# Patient Record
Sex: Male | Born: 1955 | Race: Black or African American | Hispanic: No | Marital: Married
Health system: Southern US, Community
[De-identification: ages and names within clinical notes are randomized; demographics above are authoritative.]

---

## 2021-01-10 ENCOUNTER — Emergency Department (HOSPITAL_COMMUNITY): Payer: BC Managed Care – PPO

## 2021-01-10 ENCOUNTER — Emergency Department (HOSPITAL_COMMUNITY)
Admission: EM | Admit: 2021-01-10 | Discharge: 2021-01-11 | Disposition: A | Discharge status: Other Acute Inpatient Hospital | Payer: BC Managed Care – PPO | Attending: Emergency Medicine | Admitting: Emergency Medicine

## 2021-01-10 ENCOUNTER — Encounter (HOSPITAL_COMMUNITY): Payer: Self-pay | Admitting: Emergency Medicine

## 2021-01-10 ENCOUNTER — Other Ambulatory Visit: Payer: Self-pay

## 2021-01-10 DIAGNOSIS — S62646A Nondisplaced fracture of proximal phalanx of right little finger, initial encounter for closed fracture: Secondary | ICD-10-CM

## 2021-01-10 DIAGNOSIS — S51811A Laceration without foreign body of right forearm, initial encounter: Secondary | ICD-10-CM | POA: Insufficient documentation

## 2021-01-10 DIAGNOSIS — S59911A Unspecified injury of right forearm, initial encounter: Secondary | ICD-10-CM | POA: Diagnosis present

## 2021-01-10 DIAGNOSIS — W19XXXA Unspecified fall, initial encounter: Secondary | ICD-10-CM

## 2021-01-10 DIAGNOSIS — R52 Pain, unspecified: Secondary | ICD-10-CM

## 2021-01-10 DIAGNOSIS — Z20822 Contact with and (suspected) exposure to covid-19: Secondary | ICD-10-CM | POA: Diagnosis not present

## 2021-01-10 DIAGNOSIS — Y9367 Activity, basketball: Secondary | ICD-10-CM | POA: Insufficient documentation

## 2021-01-10 DIAGNOSIS — M79675 Pain in left toe(s): Secondary | ICD-10-CM | POA: Insufficient documentation

## 2021-01-10 DIAGNOSIS — S50822A Blister (nonthermal) of left forearm, initial encounter: Secondary | ICD-10-CM | POA: Diagnosis not present

## 2021-01-10 DIAGNOSIS — M7989 Other specified soft tissue disorders: Secondary | ICD-10-CM

## 2021-01-10 MED ORDER — TETANUS-DIPHTH-ACELL PERTUSSIS 5-2.5-18.5 LF-MCG/0.5 IM SUSY: 0.5000 mL | PREFILLED_SYRINGE | Freq: Once | INTRAMUSCULAR | Status: DC

## 2021-01-10 NOTE — Consult Note (Signed)
ORTHOPAEDIC CONSULTATION  REQUESTING PHYSICIAN: Palumbo, April, MD  Time called na Time arrived na  Chief Complaint: right arm injury   HPI: George Dunlap. is a 65 y.o. male who complains of a fall playing basketball today under his right arm.  He takes an aspirin daily.  This occurred today around 6:00.  He has had some pain at his small finger a burning pain over his dorsal forearm and swelling.  It is worsened in the emergency room there is a fair amount of blistering over his dorsal forearm.  He denies pain with movement of his wrist and fingers.  He denies numbness or tingling other than directly over the swelling  History reviewed. No pertinent past medical history. History reviewed. No pertinent surgical history. Social History   Socioeconomic History   Marital status: Married    Spouse name: Not on file   Number of children: Not on file   Years of education: Not on file   Highest education level: Not on file  Occupational History   Not on file  Tobacco Use   Smoking status: Not on file   Smokeless tobacco: Not on file  Substance and Sexual Activity   Alcohol use: Not on file   Drug use: Not on file   Sexual activity: Not on file  Other Topics Concern   Not on file  Social History Narrative   Not on file   Social Determinants of Health   Financial Resource Strain: Not on file  Food Insecurity: Not on file  Transportation Needs: Not on file  Physical Activity: Not on file  Stress: Not on file  Social Connections: Not on file   History reviewed. No pertinent family history. No Known Allergies Prior to Admission medications   Not on File   DG Elbow Complete Right  Result Date: 01/10/2021 CLINICAL DATA:  Fall EXAM: RIGHT ELBOW - COMPLETE 3+ VIEW COMPARISON:  None. FINDINGS: There is no evidence of fracture, dislocation, or joint effusion. There is no evidence of arthropathy or other focal bone abnormality. Soft tissue swelling. IMPRESSION: Soft tissue  swelling without fracture or dislocation of the right elbow. Electronically Signed   By: Ulyses Jarred M.D.   On: 01/10/2021 22:05   DG Forearm Right  Result Date: 01/10/2021 CLINICAL DATA:  Fall EXAM: RIGHT FOREARM - 2 VIEW COMPARISON:  None. FINDINGS: There is no evidence of fracture or other focal bone lesions. Soft tissues are unremarkable. IMPRESSION: Negative. Electronically Signed   By: Ulyses Jarred M.D.   On: 01/10/2021 22:05   DG Hand Complete Right  Result Date: 01/10/2021 CLINICAL DATA:  Fall EXAM: RIGHT HAND - COMPLETE 3+ VIEW COMPARISON:  None. FINDINGS: Minimally displaced oblique fracture of the base of the proximal phalanx. No other fracture. No dislocation. IMPRESSION: Minimally displaced oblique fracture of the base of the proximal phalanx of the right hand. Electronically Signed   By: Ulyses Jarred M.D.   On: 01/10/2021 22:07   DG Finger Little Right  Result Date: 01/10/2021 CLINICAL DATA:  Fall EXAM: RIGHT LITTLE FINGER 2+V COMPARISON:  None. FINDINGS: Fracture at the base of the right fifth proximal phalanx. 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 IMPRESSION: Nondisplaced fracture of the base of the right fifth proximal phalanx. Severe osteoarthrosis of the interphalangeal joints. Electronically Signed   By: Ulyses Jarred M.D.   On: 01/10/2021 22:04   DG Toe Great Left  Result Date: 01/10/2021 CLINICAL DATA:  Fall EXAM: LEFT  GREAT TOE COMPARISON:  None. FINDINGS: There is no evidence of fracture or dislocation. There is no evidence of arthropathy or other focal bone abnormality. Soft tissues are unremarkable. IMPRESSION: Negative. Electronically Signed   By: Ulyses Jarred M.D.   On: 01/10/2021 22:08    Positive ROS: All other systems have been reviewed and were otherwise negative with the exception of those mentioned in the HPI and as above.  Labs cbc No results for input(s): WBC, HGB, HCT, PLT in the last 72 hours.  Labs inflam No  results for input(s): CRP in the last 72 hours.  Invalid input(s): ESR  Labs coag No results for input(s): INR, PTT in the last 72 hours.  Invalid input(s): PT  No results for input(s): NA, K, CL, CO2, GLUCOSE, BUN, CREATININE, CALCIUM in the last 72 hours.  Physical Exam: Vitals:   01/10/21 2330 01/10/21 2335  BP:  (!) 142/93  Pulse: 66 65  Resp:  18  Temp:    SpO2: 98% 98%   General: Alert, no acute distress Cardiovascular: No pedal edema Respiratory: No cyanosis, no use of accessory musculature GI: No organomegaly, abdomen is soft and non-tender Skin: No lesions in the area of chief complaint other than those listed below in MSK exam.  Neurologic: Sensation intact distally save for the below mentioned MSK exam Psychiatric: Patient is competent for consent with normal mood and affect Lymphatic: No axillary or cervical lymphadenopathy  MUSCULOSKELETAL: The right upper extremity large dorsal swelling with clear blistering and weeping.  Small abrasion distally he has some very slight decrease sensation at his small finger otherwise neurovascularly intact painless passive motion at the wrist and fingers.  Painless motion at the elbow.  Hand is warm and well-perfused  Other extremities are atraumatic with painless ROM and NVI.  Assessment: Right upper extremity injury  Plan: Likely has a shearing injury in the subdermal region of the right forearm P1 fracture at the base of his small finger.  There is significant swelling he will be transferred to [facility] for observation for compartment syndrome development.  Sling for now   Renette Butters, MD    01/10/2021 11:47 PM

## 2021-01-10 NOTE — ED Provider Notes (Signed)
Emergency Medicine Provider Triage Evaluation Note  George Dunlap. , a 65 y.o. male  was evaluated in triage.  Pt complains of right forearm deformity, right pinky pain and left big toe pain after falling while playing basketball with his grandchildren.  Denies any head trauma, LOC, blood thinners, blurry vision, or double vision.  Does endorse significant pain to his right forearm..  Review of Systems  Positive: Pain and deformity right forearm, tenderness palpation of the right pinky, lacerations and tenderness palpation of the medial left great toe Negative: Anticoagulation, head trauma, loss of consciousness, blurry vision, or double vision.  Physical Exam  BP (!) 176/95 (BP Location: Left Arm)   Pulse 63   Temp 98.2 F (36.8 C) (Oral)   Resp 16   Ht 5\' 6"  (1.676 m)   Wt 130.2 kg   SpO2 99%   BMI 46.32 kg/m  Gen:   Awake, no distress   Resp:  Normal effort  MSK:   Moves extremities without difficulty  Other:  Deformity of the right forearm with significant edema which is firm to palpation.  Palpable radial pulse which is 2+, normal cap refill in all 5 digits, bruising to the area as well as weeping secondary to edema.  At the proximal right pinky, laceration and pain to the medial left great toe.  Medical Decision Making  Medically screening exam initiated at 8:46 PM.  Appropriate orders placed.  . was informed that the remainder of the evaluation will be completed by another provider, this initial triage assessment does not replace that evaluation, and the importance of remaining in the ED until their evaluation is complete.  This chart was dictated using voice recognition software, Dragon. Despite the best efforts of this provider to proofread and correct errors, errors may still occur which can change documentation meaning.    George Dunlap 01/10/21 2213    2214, MD 01/14/21 [phone]

## 2021-01-10 NOTE — ED Triage Notes (Signed)
Patient reports he was playing basketball and fell, denies hitting head or LOC, not on thinners, patient with swelling, bruising, and deformity to R forearm and elbow, R pinky finger and R big toe

## 2021-01-10 NOTE — ED Provider Notes (Addendum)
[facility] EMERGENCY DEPARTMENT Provider Note   CSN: [id number] Arrival date & time: 01/10/21  1913     History Chief Complaint  Patient presents with   Arm Injury   Fall   Toe Pain    George Dunlap. is a 65 y.o. male.  The history is provided by the patient.  Arm Injury Location:  Elbow Elbow location:  R elbow Injury: yes   Time since incident:  5 hours Mechanism of injury: fall   Mechanism of injury comment:  On aspirin Fall:    Fall occurred:  Recreating/playing (basketball)   Height of fall:  Standing   Impact surface:  Concrete   Point of impact:  Outstretched arms   Entrapped after fall: no   Pain details:    Quality:  Aching   Radiates to:  Does not radiate   Severity:  Severe   Onset quality:  Sudden   Duration:  5 hours   Timing:  Constant   Progression:  Unchanged Handedness:  Right-handed Dislocation: no   Foreign body present:  No foreign bodies Tetanus status:  Unknown Prior injury to area:  No Relieved by:  Nothing Worsened by:  Nothing Ineffective treatments:  None tried Associated symptoms: no back pain and no fever   Risk factors: no concern for non-accidental trauma   Patient who is right handed on ASA 81 mg fell on outstretched hand playing basketball and broken R small finger and had immediate swelling of the right elbow and forearm.  Patient did not strike head, no LOC.  No neck or back pain. No n/v/d.  No seizure like activity.     History reviewed. No pertinent past medical history.  There are no problems to display for this patient.   History reviewed. No pertinent surgical history.     History reviewed. No pertinent family history.     Home Medications Prior to Admission medications   Not on File    Allergies    Patient has no known allergies.  Review of Systems   Review of Systems  Constitutional:  Negative for fever.  HENT:  Negative for drooling.   Eyes:  Negative for redness.  Respiratory:   Negative for wheezing.   Cardiovascular:  Negative for chest pain.  Gastrointestinal:  Negative for vomiting.  Genitourinary:  Negative for flank pain.  Musculoskeletal:  Negative for back pain.  Skin:  Negative for rash.  Neurological:  Negative for facial asymmetry.  Psychiatric/Behavioral:  Negative for agitation.   All other systems reviewed and are negative.  Physical Exam Updated Vital Signs BP (!) 142/93   Pulse 65   Temp 98.2 F (36.8 C) (Oral)   Resp 18   Ht 5\' 6"  (1.676 m)   Wt 130.2 kg   SpO2 98%   BMI 46.32 kg/m   Physical Exam Vitals and nursing note reviewed. Exam conducted with a chaperone present.  Constitutional:      Appearance: Normal appearance. He is not diaphoretic.  HENT:     Head: Normocephalic and atraumatic.     Nose: Nose normal.     Mouth/Throat:     Mouth: Mucous membranes are moist.  Eyes:     Conjunctiva/sclera: Conjunctivae normal.     Pupils: Pupils are equal, round, and reactive to light.  Cardiovascular:     Rate and Rhythm: Normal rate and regular rhythm.     Pulses: Normal pulses.     Heart sounds: Normal heart sounds.  Pulmonary:  Effort: Pulmonary effort is normal.     Breath sounds: Normal breath sounds.  Abdominal:     General: Abdomen is flat. Bowel sounds are normal.     Palpations: Abdomen is soft.     Tenderness: There is no abdominal tenderness.  Musculoskeletal:     Right elbow: Swelling present. No deformity or lacerations. Decreased range of motion. Tenderness present.     Right forearm: Swelling, laceration and tenderness present.     Right wrist: No bony tenderness or snuff box tenderness.     Right hand: There is no disruption of two-point discrimination.     Cervical back: Normal range of motion and neck supple. No tenderness.     Comments: Blistering to the lateral right forearm.  Pain out of proportion, poikiothermia   Skin:    General: Skin is warm and dry.  Neurological:     General: No focal deficit  present.     Mental Status: He is alert and oriented to person, place, and time.     Deep Tendon Reflexes: Reflexes normal.  Psychiatric:        Mood and Affect: Mood normal.        Behavior: Behavior normal.       ED Results / Procedures / Treatments   Labs (all labs ordered are listed, but only abnormal results are displayed) Results for orders placed or performed during the hospital encounter of 01/10/21  CBC with Differential/Platelet  Result Value Ref Range   WBC 9.1 4.0 - 10.5 K/uL   RBC 5.10 4.22 - 5.81 MIL/uL   Hemoglobin 11.6 (L) 13.0 - 17.0 g/dL   HCT 82.9 56.2 - 13.0 %   MCV 76.7 (L) 80.0 - 100.0 fL   MCH 22.7 (L) 26.0 - 34.0 pg   MCHC 29.7 (L) 30.0 - 36.0 g/dL   RDW 86.5 (H) 78.4 - 69.6 %   Platelets 315 150 - 400 K/uL   nRBC 0.0 0.0 - 0.2 %   Neutrophils Relative % 66 %   Neutro Abs 6.0 1.7 - 7.7 K/uL   Lymphocytes Relative 25 %   Lymphs Abs 2.3 0.7 - 4.0 K/uL   Monocytes Relative 6 %   Monocytes Absolute 0.6 0.1 - 1.0 K/uL   Eosinophils Relative 2 %   Eosinophils Absolute 0.2 0.0 - 0.5 K/uL   Basophils Relative 1 %   Basophils Absolute 0.1 0.0 - 0.1 K/uL   Immature Granulocytes 0 %   Abs Immature Granulocytes 0.03 0.00 - 0.07 K/uL  POC SARS Coronavirus 2 Ag-ED - Nasal Swab  Result Value Ref Range   SARSCOV2ONAVIRUS 2 AG NEGATIVE NEGATIVE   DG Elbow Complete Right  Result Date: 01/10/2021 CLINICAL DATA:  Fall EXAM: RIGHT ELBOW - COMPLETE 3+ VIEW COMPARISON:  None. FINDINGS: There is no evidence of fracture, dislocation, or joint effusion. There is no evidence of arthropathy or other focal bone abnormality. Soft tissue swelling. IMPRESSION: Soft tissue swelling without fracture or dislocation of the right elbow. Electronically Signed   By: Deatra Robinson M.D.   On: 01/10/2021 22:05   DG Forearm Right  Result Date: 01/10/2021 CLINICAL DATA:  Fall EXAM: RIGHT FOREARM - 2 VIEW COMPARISON:  None. FINDINGS: There is no evidence of fracture or other focal bone  lesions. Soft tissues are unremarkable. IMPRESSION: Negative. Electronically Signed   By: Deatra Robinson M.D.   On: 01/10/2021 22:05   DG Hand Complete Right  Result Date: 01/10/2021 CLINICAL DATA:  Fall EXAM: RIGHT HAND -  COMPLETE 3+ VIEW COMPARISON:  None. FINDINGS: Minimally displaced oblique fracture of the base of the proximal phalanx. No other fracture. No dislocation. IMPRESSION: Minimally displaced oblique fracture of the base of the proximal phalanx of the right hand. Electronically Signed   By: Deatra Robinson M.D.   On: 01/10/2021 22:07   DG Finger Little Right  Result Date: 01/10/2021 CLINICAL DATA:  Fall EXAM: RIGHT LITTLE FINGER 2+V COMPARISON:  None. FINDINGS: Fracture at the base of the right fifth proximal phalanx. 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 IMPRESSION: Nondisplaced fracture of the base of the right fifth proximal phalanx. Severe osteoarthrosis of the interphalangeal joints. Electronically Signed   By: Deatra Robinson M.D.   On: 01/10/2021 22:04   DG Toe Great Left  Result Date: 01/10/2021 CLINICAL DATA:  Fall EXAM: LEFT GREAT TOE COMPARISON:  None. FINDINGS: There is no evidence of fracture or dislocation. There is no evidence of arthropathy or other focal bone abnormality. Soft tissues are unremarkable. IMPRESSION: Negative. Electronically Signed   By: Deatra Robinson M.D.   On: 01/10/2021 22:08    Radiology DG Elbow Complete Right  Result Date: 01/10/2021 CLINICAL DATA:  Fall EXAM: RIGHT ELBOW - COMPLETE 3+ VIEW COMPARISON:  None. FINDINGS: There is no evidence of fracture, dislocation, or joint effusion. There is no evidence of arthropathy or other focal bone abnormality. Soft tissue swelling. IMPRESSION: Soft tissue swelling without fracture or dislocation of the right elbow. Electronically Signed   By: Deatra Robinson M.D.   On: 01/10/2021 22:05   DG Forearm Right  Result Date: 01/10/2021 CLINICAL DATA:  Fall EXAM: RIGHT FOREARM - 2  VIEW COMPARISON:  None. FINDINGS: There is no evidence of fracture or other focal bone lesions. Soft tissues are unremarkable. IMPRESSION: Negative. Electronically Signed   By: Deatra Robinson M.D.   On: 01/10/2021 22:05   DG Hand Complete Right  Result Date: 01/10/2021 CLINICAL DATA:  Fall EXAM: RIGHT HAND - COMPLETE 3+ VIEW COMPARISON:  None. FINDINGS: Minimally displaced oblique fracture of the base of the proximal phalanx. No other fracture. No dislocation. IMPRESSION: Minimally displaced oblique fracture of the base of the proximal phalanx of the right hand. Electronically Signed   By: Deatra Robinson M.D.   On: 01/10/2021 22:07   DG Finger Little Right  Result Date: 01/10/2021 CLINICAL DATA:  Fall EXAM: RIGHT LITTLE FINGER 2+V COMPARISON:  None. FINDINGS: Fracture at the base of the right fifth proximal phalanx. 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 IMPRESSION: Nondisplaced fracture of the base of the right fifth proximal phalanx. Severe osteoarthrosis of the interphalangeal joints. Electronically Signed   By: Deatra Robinson M.D.   On: 01/10/2021 22:04   DG Toe Great Left  Result Date: 01/10/2021 CLINICAL DATA:  Fall EXAM: LEFT GREAT TOE COMPARISON:  None. FINDINGS: There is no evidence of fracture or dislocation. There is no evidence of arthropathy or other focal bone abnormality. Soft tissues are unremarkable. IMPRESSION: Negative. Electronically Signed   By: Deatra Robinson M.D.   On: 01/10/2021 22:08    Procedures Procedures   Medications Ordered in ED Medications  Tdap (BOOSTRIX) injection 0.5 mL (has no administration in time range)    ED Course  I have reviewed the triage vital signs and the nursing notes.  Pertinent labs & imaging results that were available during my care of the patient were reviewed by me and considered in my medical decision making (see chart for details).  Clinical Course  as of 01/11/21 0046  Sun Jan 10, 2021  2227 Interval  development increasing firmness to palpation of the patient's right forearm, concerning for developing compartment syndrome.  Consult to hand surgeon, Dr. Eulah PontMurphy, who recommends placing patient with ice and compression wrap.  No further emergent intervention required from a hand surgery perspective at this time given patient has 2+ radial pulses on that side.  Dr. Eulah PontMurphy states he remains available for consult once patient is roomed in the emergency department, should ED provider feel his neurovascular status decline. [RS]    Clinical Course User Index [RS] Sponseller, Eugene Gaviaebekah R, PA-C   Concern for compartment syndrome, pain out of proportion to exam.  Marked swelling about the right elbow and distal to the to the R elbow.    1125 Case d/w Dr. Eulah PontMurphy of orthopedics who will see the patient but does not treat compartment injuries.  [facility]   1130 case d/[facility]Hug[facility]  Final Clinical Impression(s) / ED Diagnoses Final diagnoses:  Pain    [facility]       Bowe Sidor, MD 01/11/21 [phone]

## 2021-01-11 ENCOUNTER — Other Ambulatory Visit (HOSPITAL_COMMUNITY): Payer: BC Managed Care – PPO

## 2021-01-11 LAB — CBC WITH DIFFERENTIAL/PLATELET
Abs Immature Granulocytes: 0.03 10*3/uL (ref 0.00–0.07)
Basophils Absolute: 0.1 10*3/uL (ref 0.0–0.1)
Basophils Relative: 1 %
Eosinophils Absolute: 0.2 10*3/uL (ref 0.0–0.5)
Eosinophils Relative: 2 %
HCT: 39.1 % (ref 39.0–52.0)
Hemoglobin: 11.6 g/dL — ABNORMAL LOW (ref 13.0–17.0)
Immature Granulocytes: 0 %
Lymphocytes Relative: 25 %
Lymphs Abs: 2.3 10*3/uL (ref 0.7–4.0)
MCH: 22.7 pg — ABNORMAL LOW (ref 26.0–34.0)
MCHC: 29.7 g/dL — ABNORMAL LOW (ref 30.0–36.0)
MCV: 76.7 fL — ABNORMAL LOW (ref 80.0–100.0)
Monocytes Absolute: 0.6 10*3/uL (ref 0.1–1.0)
Monocytes Relative: 6 %
Neutro Abs: 6 10*3/uL (ref 1.7–7.7)
Neutrophils Relative %: 66 %
Platelets: 315 10*3/uL (ref 150–400)
RBC: 5.1 MIL/uL (ref 4.22–5.81)
RDW: 16.3 % — ABNORMAL HIGH (ref 11.5–15.5)
WBC: 9.1 10*3/uL (ref 4.0–10.5)
nRBC: 0 % (ref 0.0–0.2)

## 2021-01-11 LAB — RESP PANEL BY RT-PCR (FLU A&B, COVID) ARPGX2
Influenza A by PCR: NEGATIVE
Influenza B by PCR: NEGATIVE
SARS Coronavirus 2 by RT PCR: NEGATIVE

## 2021-01-11 LAB — POC SARS CORONAVIRUS 2 AG -  ED: SARSCOV2ONAVIRUS 2 AG: NEGATIVE

## 2021-01-11 MED ORDER — FENTANYL CITRATE (PF) 100 MCG/2ML IJ SOLN
50.0000 ug | Freq: Once | INTRAMUSCULAR | Status: AC
  Administered 2021-01-11: 50 ug via INTRAVENOUS
  Filled 2021-01-11: qty 2

## 2021-01-11 NOTE — Progress Notes (Signed)
Orthopedic Tech Progress Note Patient Details:  George Dunlap. Jun 13, 1956 [id number]  Ortho Devices Type of Ortho Device: Finger splint, Ace wrap Ortho Device/Splint Location: rue 5th finger splint. rue ace wrap to forearm. Ortho Device/Splint Interventions: Ordered, Application, Adjustment  I applied an ace wrap to the patients forearm over a dressing at dr murphy's request. I then applied a finger splint to the 5th finger as an ulna gutter splint would not fit over the dressing and ace wrap and be properly applied. The ed Dr approved.  Post Interventions Patient Tolerated: Well Instructions Provided: Care of device, Adjustment of device  Trinna Post 01/11/2021, 1:19 AM

## 2021-01-11 NOTE — ED Notes (Signed)
Called [facility] for transport for patient to [facility].

## 2022-04-22 IMAGING — DX DG FOREARM 2V*R*
2 series · 2 of 2 positions shown · non-contrast
Comparison: None.

CLINICAL DATA: Fall

EXAM:
RIGHT FOREARM - 2 VIEW

[forearm ap]
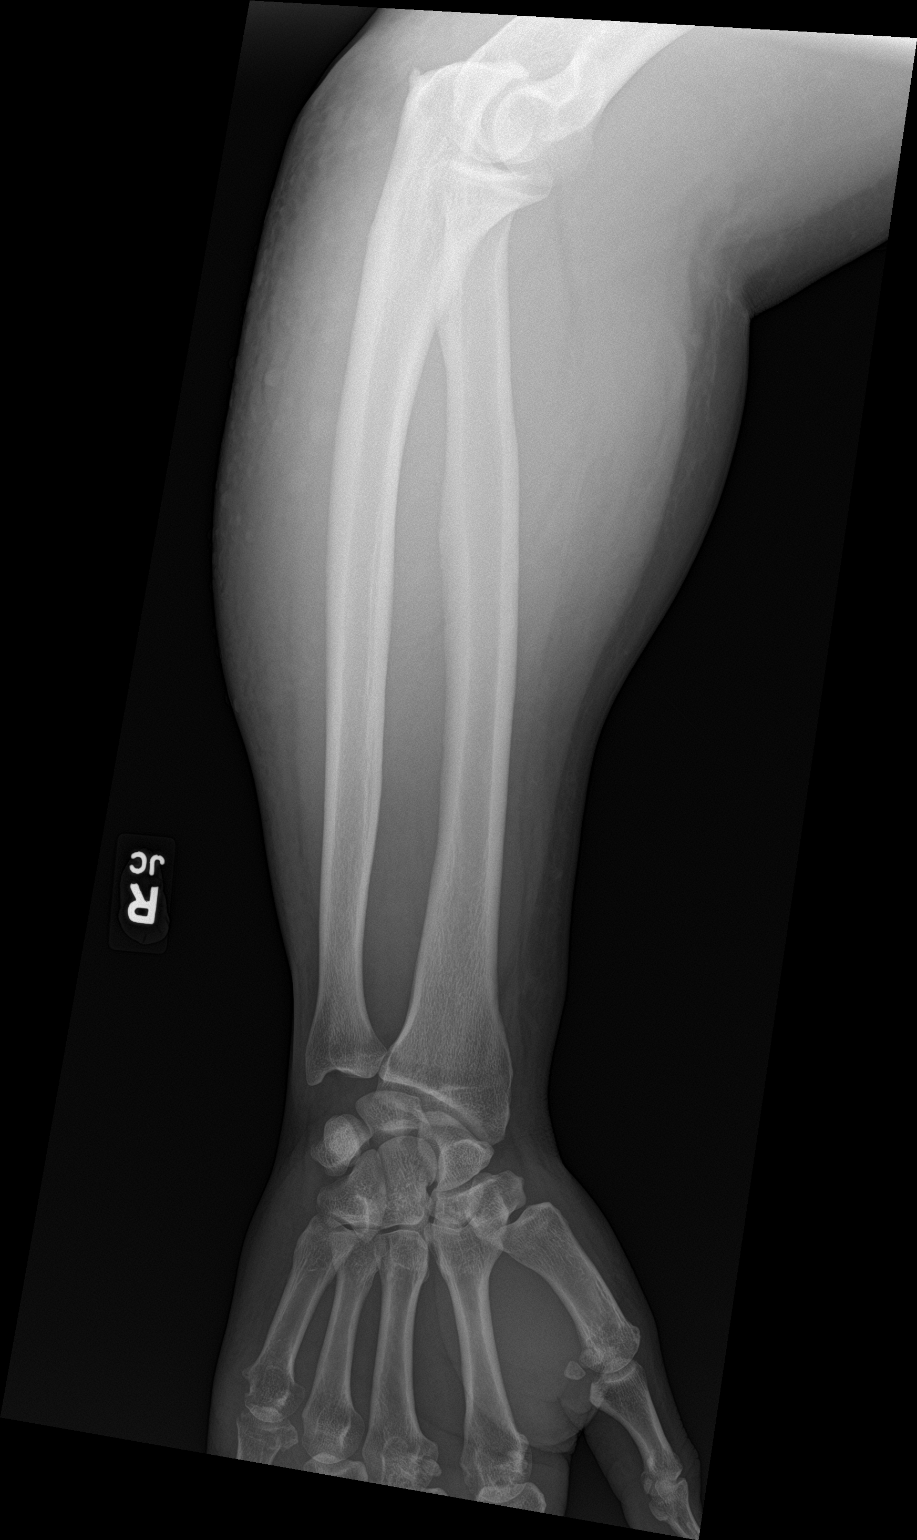

[forearm lat]
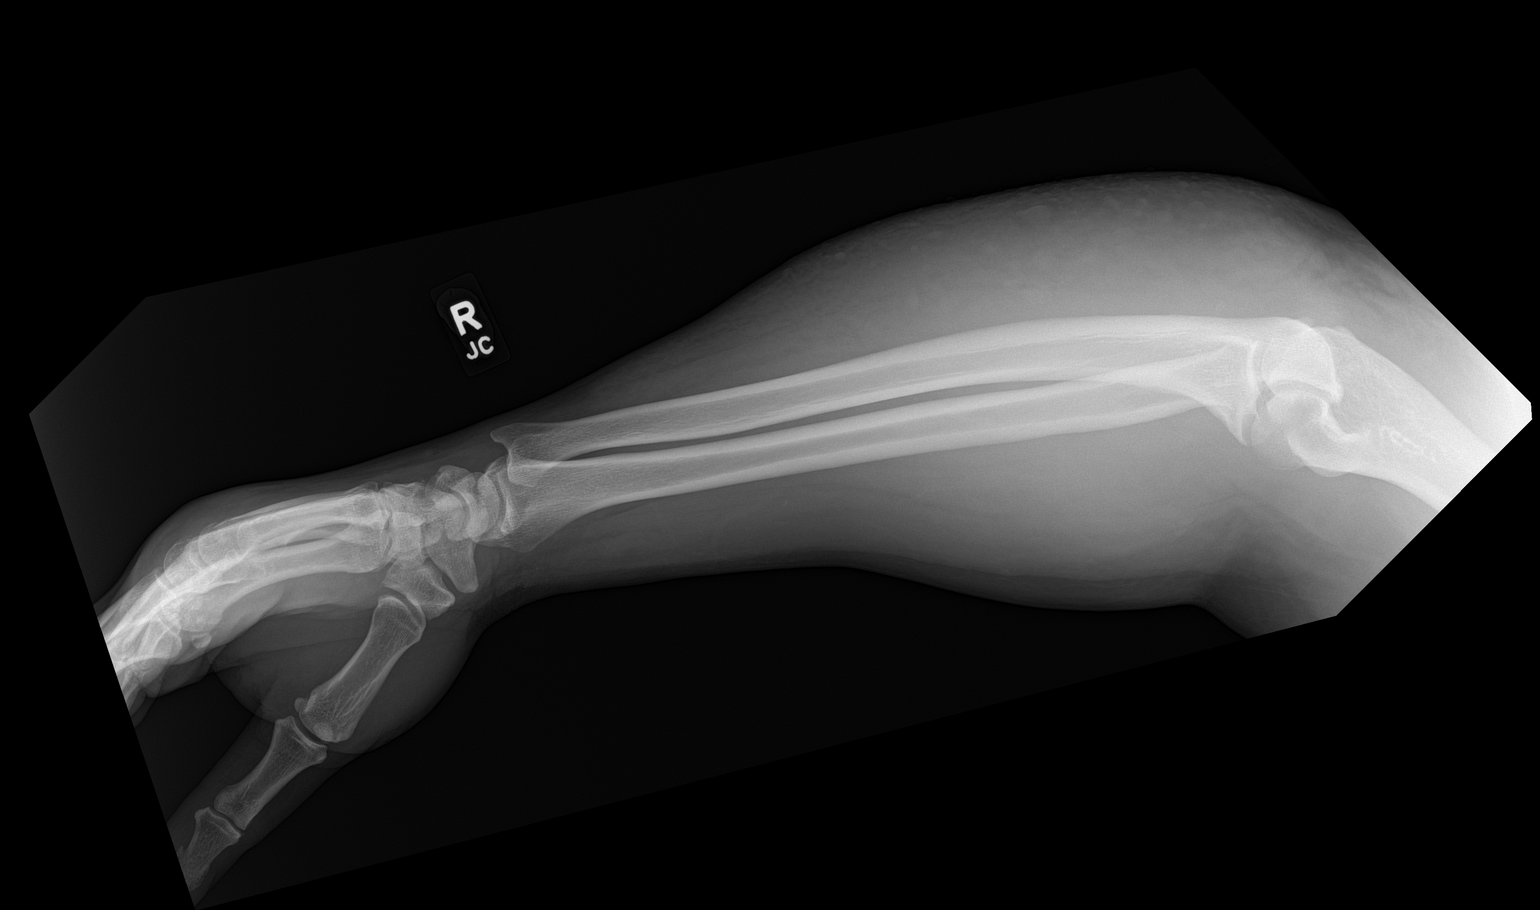

[2 of 2 positions shown; findings below may reference images not displayed]

FINDINGS: There is no evidence of fracture or other focal bone lesions. Soft
tissues are unremarkable.
IMPRESSION: Negative.
